# Patient Record
Sex: Male | Born: 1960 | Race: Black or African American | Hispanic: No | Marital: Single
Health system: Western US, Academic
[De-identification: ages and names within clinical notes are randomized; demographics above are authoritative.]

---

## 2015-04-01 ENCOUNTER — Emergency Department (EMERGENCY_DEPARTMENT_HOSPITAL): Payer: BLUE CROSS/BLUE SHIELD

## 2015-04-01 ENCOUNTER — Emergency Department
Admission: EM | Admit: 2015-04-01 | Discharge: 2015-04-01 | Disposition: A | Discharge status: Home / Self Care | Payer: BLUE CROSS/BLUE SHIELD | Attending: Emergency Medicine | Admitting: Emergency Medicine

## 2015-04-01 DIAGNOSIS — M545 Low back pain: Principal | ICD-10-CM | POA: Insufficient documentation

## 2015-04-01 DIAGNOSIS — T84498A Other mechanical complication of other internal orthopedic devices, implants and grafts, initial encounter: Secondary | ICD-10-CM

## 2015-04-01 DIAGNOSIS — M5441 Lumbago with sciatica, right side: Secondary | ICD-10-CM

## 2015-04-01 DIAGNOSIS — I10 Essential (primary) hypertension: Secondary | ICD-10-CM | POA: Insufficient documentation

## 2015-04-01 DIAGNOSIS — Z981 Arthrodesis status: Secondary | ICD-10-CM | POA: Insufficient documentation

## 2015-04-01 DIAGNOSIS — M4327 Fusion of spine, lumbosacral region: Secondary | ICD-10-CM

## 2015-04-01 DIAGNOSIS — G8929 Other chronic pain: Secondary | ICD-10-CM | POA: Insufficient documentation

## 2015-04-01 LAB — URINALYSIS-COMPLETE
BILIRUBIN URINE: NEGATIVE
GLUCOSE URINE: NEGATIVE mg/dL
KETONES: NEGATIVE mg/dL
LEUK. ESTERASE: NEGATIVE
NITRITE URINE: NEGATIVE
OCCULT BLOOD URINE: NEGATIVE mg/dL
PH URINE: 5 (ref 4.8–7.8)
PROTEIN URINE: NEGATIVE mg/dL
SPECIFIC GRAVITY: 1.007 (ref 1.002–1.030)
UROBILINOGEN.: NEGATIVE mg/dL (ref ?–2.0)

## 2015-04-01 LAB — BASIC METABOLIC PANEL
CALCIUM: 8.9 mg/dL (ref 8.6–10.5)
CARBON DIOXIDE TOTAL: 25 meq/L (ref 24–32)
CHLORIDE: 105 meq/L (ref 95–110)
CREATININE BLOOD: 0.94 mg/dL (ref 0.44–1.27)
GLUCOSE: 92 mg/dL (ref 70–99)
POTASSIUM: 3.9 meq/L (ref 3.3–5.0)
SODIUM: 140 meq/L (ref 135–145)
UREA NITROGEN, BLOOD (BUN): 5 mg/dL — AB (ref 8–22)

## 2015-04-01 LAB — CBC WITH DIFFERENTIAL
BASOPHILS % AUTO: 0.3 %
BASOPHILS ABS AUTO: 0 10*3/uL (ref 0–0.2)
EOSINOPHIL % AUTO: 0.4 %
EOSINOPHIL ABS AUTO: 0 10*3/uL (ref 0–0.5)
HEMATOCRIT: 42.8 % (ref 41–53)
HEMOGLOBIN: 14 g/dL (ref 13.5–17.5)
LYMPHOCYTE ABS AUTO: 1.5 10*3/uL (ref 1.0–4.8)
LYMPHOCYTES % AUTO: 21.2 %
MCH: 28.9 pg (ref 27–33)
MCHC: 32.7 % (ref 32–36)
MCV: 88.5 UM3 (ref 80–100)
MONOCYTES % AUTO: 7.2 %
MONOCYTES ABS AUTO: 0.5 10*3/uL (ref 0.1–0.8)
MPV: 8.1 UM3 (ref 6.8–10.0)
NEUTROPHIL ABS AUTO: 5.2 10*3/uL (ref 1.80–7.70)
NEUTROPHILS % AUTO: 70.9 %
PLATELET COUNT: 225 10*3/uL (ref 130–400)
RDW: 14.2 U (ref 0–14.7)
RED CELL COUNT: 4.83 10*6/uL (ref 4.5–5.9)
WHITE BLOOD CELL COUNT: 7.3 10*3/uL (ref 4.5–11.0)

## 2015-04-01 LAB — C-REACTIVE PROTEIN: C-REACTIVE PROTEIN: 0.1 mg/dL (ref 0–0.8)

## 2015-04-01 LAB — SED RATE WESTERGREN: SED RATE WESTERGREN: 10 mm/h (ref <=20)

## 2015-04-01 MED ORDER — KETOROLAC 30 MG/ML (1 ML) INJECTION SOLUTION
30.0000 mg | Freq: Once | INTRAMUSCULAR | Status: AC
Start: 2015-04-01 — End: 2015-04-01
  Administered 2015-04-01: 30 mg via INTRAVENOUS
  Filled 2015-04-01: qty 1

## 2015-04-01 MED ORDER — HYDROMORPHONE 1 MG/ML INJECTION SYRINGE
1.0000 mg | INJECTION | Freq: Once | INTRAMUSCULAR | Status: AC
Start: 2015-04-01 — End: 2015-04-01
  Administered 2015-04-01: 1 mg via INTRAVENOUS
  Filled 2015-04-01: qty 1

## 2015-04-01 MED ORDER — HYDROCODONE 10 MG-ACETAMINOPHEN 325 MG TABLET
1.0000 | ORAL_TABLET | Freq: Four times a day (QID) | ORAL | 0 refills | Status: AC | PRN
Start: 2015-04-01 — End: 2015-04-11

## 2015-04-01 MED ORDER — IBUPROFEN 800 MG TABLET
800.0000 mg | ORAL_TABLET | Freq: Three times a day (TID) | ORAL | 0 refills | Status: AC | PRN
Start: 2015-04-01 — End: 2015-04-07

## 2015-04-01 NOTE — ED Triage Note (Signed)
Hx of chronic back pain, denies any new trauma or injury. Pt is homeless and in a shelter and sleeping on a floor and says is exacerbating back pain. Taking ibuprofen and norco prn. Was seen at [facility] a few weeks ago and states was given a shot which helped pain for a while. Denies any loss of bowel or bladder.

## 2015-04-01 NOTE — ED Nursing Note (Signed)
Pt given food and change of close.  Ambulatory with cane. Dc instructions given

## 2015-04-01 NOTE — Discharge Instructions (Signed)
Thank you for visiting [place]. You have been diagnosed with back and leg pain likely from worsening of your old surgery. These symptoms and your xrays (which showed no fractures but do show loosening screws) are consistent with your history and sympotms. For now take ibuprofen and norco (10/325 - stronger than your prior prescription) for pain and to aid with sleep (DO NOT drink or drive on norco!). Take a stool softener as well (colace) to prevent constipation. Continue your care at [facility] as planned for orthopedic referral. Return to an emergency room for worsening pain, leg numbness/weakness or swelling, difficulty or accidents using the bathroom, chest pain, trouble breathing, fever, chills, headache, or any other concerns. Thank you and take care.

## 2015-04-01 NOTE — ED Nursing Note (Signed)
Pt came back from x-ray dept.  IV access inserted, blood drawn and sent to lab.  Pt medicated per MDs order.

## 2015-04-01 NOTE — ED Initial Note (Signed)
EMERGENCY DEPARTMENT PHYSICIAN NOTE - [facility]       Date of Service:   04/01/2015  7:54 PM Patient's PCP: No Pcp No Pcp   Note Started: 04/01/2015 21:11 DOB: 01-Dec-1960             Chief Complaint   Patient presents with    Back Pain Chronic           The history provided by the patient.  Interpreter used: No    Philip Hogan is a 55yr old male, with a past medical history significant for HTN, chronic lower back pain, hardware placemen for posterior fusion of L4-S1, who presents to the ED with a chief complaint of worsening back pain that occurred  2 week ago. He went to see his PCP 2 months ago and was told by imaging that the hardware was "loose". He was given norco for the pain and told to come back if the pain worsened. He had a Xray 2 months ago and a MRI on May 16th 2016.    He presents today for worsening right lower back pain with radiation down his leg. The pain is "sharp", significantly worse than normal, and is rated 9/10 in severity. He has tried norco and motin with no relief which is unusual for him. He reports the pain is alleviated by laying on his left side and is exacerbating by laying flat. Associated sciatic pain radiating down his left lower extremity. Denies saddle anaesthesia, fever, chills, difficulty breathing, chest pain, urinary or bowel hesitancy or incontinents, IVDA or other recent infection. Denies any new injury or trauma.    Pt following at [facility], has referral to both ortho spine and pain management. Next appointment on 04/04/15.    A full history, including pertinent past medical and social history was reviewed.    HISTORY:  There are no active hospital problems to display for this patient.   No Known Allergies   No past medical history on file. No past surgical history on file.   Social History    Marital status: SINGLE              Spouse name:                       Years of education:                 Number of children:               Occupational History    None on  file    Social History Main Topics    Smoking status: Not on file                          Smokeless status: Not on file                       Alcohol use: Not on file     Drug use: Not on file     Sexual activity: Not on file          Other Topics            Concern    None on file    Social History Narrative    None on file     No family history on file.             Review of Systems   Constitutional: Negative  for chills and fever.   HENT: Negative for sore throat.    Respiratory: Negative for shortness of breath.    Cardiovascular: Negative for chest pain.   Gastrointestinal: Negative for diarrhea, nausea and vomiting.   Genitourinary: Negative for dysuria.   Musculoskeletal: Positive for back pain.   Skin: Negative for rash.   Neurological:        No saddle anaesthesia.        TRIAGE VITAL SIGNS:  Temp: 36.5 C (97.7 F) (04/01/15 1836)  Temp src: Oral (04/01/15 1836)  Pulse: 82 (04/01/15 1836)  BP: 114/66 (04/01/15 1836)  Resp: 18 (04/01/15 1836)  SpO2: 100 % (04/01/15 1836)  Weight:  (refused) (04/01/15 1836)    Physical Exam   Constitutional: He is oriented to person, place, and time. He appears well-developed and well-nourished. No distress.   HENT:   Head: Normocephalic and atraumatic.   Eyes: Conjunctivae and EOM are normal. Right eye exhibits no discharge. Left eye exhibits no discharge.   Neck: Normal range of motion. Neck supple.   Cardiovascular: Normal rate, regular rhythm, normal heart sounds and intact distal pulses.  Exam reveals no gallop and no friction rub.    No murmur heard.  Pulmonary/Chest: Effort normal and breath sounds normal. No respiratory distress.   Abdominal: Soft. Bowel sounds are normal. There is no tenderness.   Musculoskeletal:   BLE: No foot clonus or menisci.  Back: Right lower TTP.   Neurological: He is alert and oriented to person, place, and time.   Skin: Skin is warm and dry. No rash noted. He is not diaphoretic.   Psychiatric: He has a normal mood and affect. His behavior  is normal.   Nursing note and vitals reviewed.        INITIAL ASSESSMENT & PLAN, MEDICAL DECISION MAKING, ED COURSE  Philip Hogan is a 14yr male who presents with a chief complaint of back pain.     Differential includes, but is not limited to: Exacerbated chronic lower back pain, hardware migration, kidney stones, AAA. Not clinically concerning for cervical spinal osteomyelitis, abscess, cord involvement.      The results of the ED evaluation were notable for the following:    Pertinent lab results:   Labs Reviewed   BASIC METABOLIC PANEL - Abnormal; Notable for the following:        Result Value    UREA NITROGEN, BLOOD (BUN) 5 (*)     All other components within normal limits   URINALYSIS-COMPLETE   CBC WITH DIFFERENTIAL   SED RATE WESTERGREN   C-REACTIVE PROTEIN    Essentially benign urine, basic labs and inflam markers    Pertinent imaging results (reviewed and interpreted independently by me):   Point of care emergency department limited ultrasound of the abdominal aorta:  Indication: back pain.    Procedure in detail: Using a convex array transducer, axial and sagittal planes of the abdominal aorta were obtained from the level of the celiac axis superiorly down to the level of the bifurcation.  The aorta measured less than 3 cm at its largest diameter.    Impression: Point of care limited ultrasound without evidence of abdominal aortic aneurysm.    These images were archived digitally and I independently interpreted the images at the bedside and agree with the documented results.        L-SPINE 2 OR 3 VIEWS: hardware to L spine and sacral spine, associated with s1 lucidity, no evidence of osteo, no priors for comparison  Radiology reads:   L-SPINE 2 OR 3 VIEWS  Status post posterior fusion of L4-S1with mild surrounding lucency  surrounding the sacral screws may be related to hardware complication.    Pertinent medications:   Medications   Hydromorphone (DILAUDID) Injection 1 mg (1 mg IV Given 04/01/15  2126)   Ketorolac (TORADOL) Injection 30 mg (30 mg IV Given 04/01/15 2126)         Chart Review: I reviewed the patient's prior medical records. Pertinent information that is relevant to this encounter: None via [place]. CURES database revealed one rx for norco 5 on 1/11 for 20 tablets, c/w pt's hx.       Patient Summary: Philip Hogan is a 55yr old male presenting to the ED today with back pain. Patient clinically well-appearing in the ED with normal vital signs, nontoxic.  History physical exam and work-up consistent with chronic lower back pain with possible hardware migration component. Patient treated here with dilaudid and Toradol for pain relief. Rx given for norco 10 after CURES inquiry and discussion of safe opiate use, outpt f/up plan. Patient discharged, instructed to follow-up with PCP in 3 days as planned for further referral. All questions answered, return precautions discussed.        LAST VITAL SIGNS:  Temp: 36.5 C (97.7 F) (04/01/15 2237)  Temp src: Oral (04/01/15 2237)  Pulse: 62 (04/01/15 2237)  BP: 131/80 (04/01/15 2237)  Resp: 18 (04/01/15 2237)  SpO2: 100 % (04/01/15 2237)  Weight:  (refused) (04/01/15 1836)      Clinical Impression: Lower back pain      Disposition: Discharge. Follow up with PCP. ED discharge instructions were reviewed and provided.      PATIENT'S GENERAL CONDITION:  Fair: Vital signs are stable and within normal limits. Patient is conscious but may be uncomfortable. Indicators are favorable.    SCRIBE STATEMENT  I, Marcina Millard, SCRIBE,  am personally taking down the notes in the presence of Dr. Raynelle Fanning.  Electronically signed by - Marcina Millard, SCRIBE, Scribe  04/01/2015  21:11      SCRIBE DISCLAIMER  I, Oneita Jolly, MD, personally performed the services described in this documentation, as scribed by the trained medical scribe above in my presence, and it is both accurate and complete.     Electronically signed by: Oneita Jolly, MD - Attending  Physician

## 2019-06-13 IMAGING — MR MRI LSPINE WO CONTRAST
5 of 6 series · 34 of 48 positions shown · non-contrast
Comparison: CT lumbar spine 06/13/2019

HISTORY: Spinal stenosis, Laminectomy syndrome
TECHNIQUE: Routine multiplanar MRI of the lumbar spine was performed without IV contrast.

[Series 2: t2_sag · sagittal · 4.0mm · 0.87mm/px · 6 of 19 slices shown]
[im 1/19]
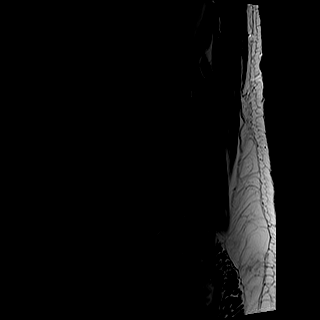
[im 4/19]
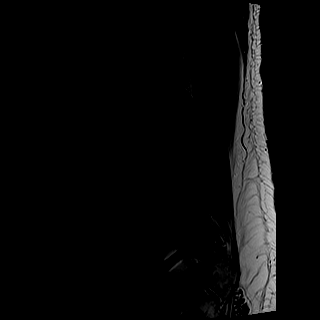
[im 8/19]
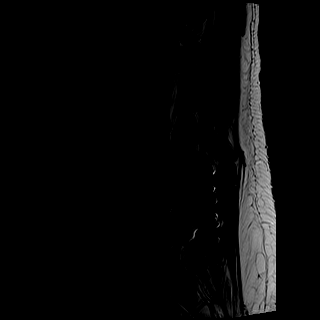
[im 11/19]
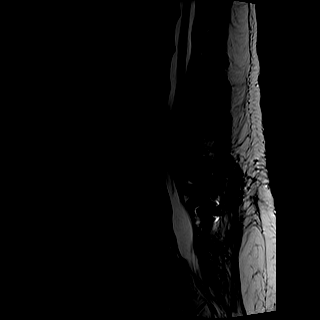
[im 15/19]
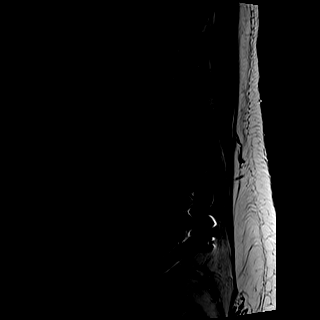
[im 19/19]
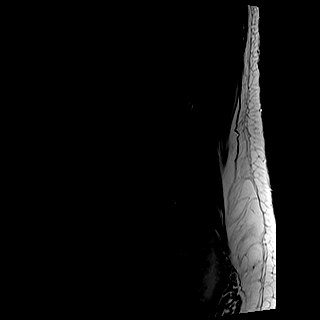

[Series 3: t1_sag · sagittal · 4.0mm · 0.87mm/px · 7 of 19 slices shown]
[im 1/19]
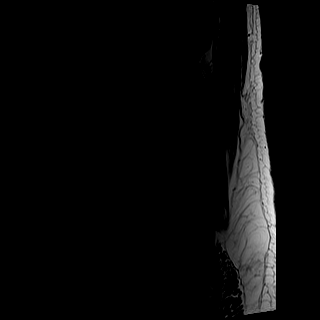
[im 4/19]
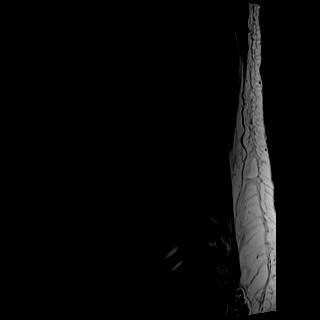
[im 7/19]
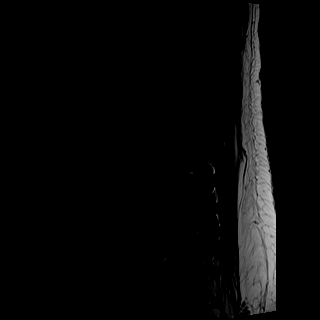
[im 10/19]
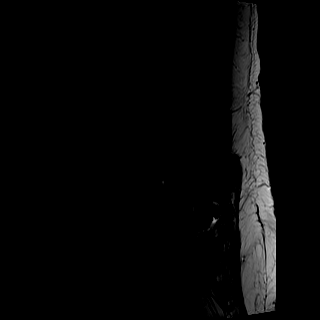
[im 13/19]
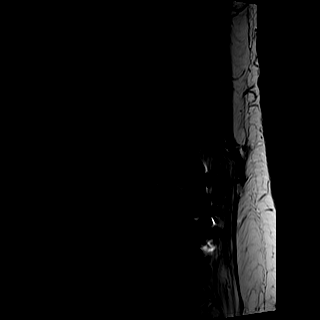
[im 16/19]
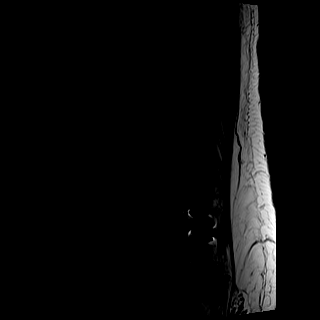
[im 19/19]
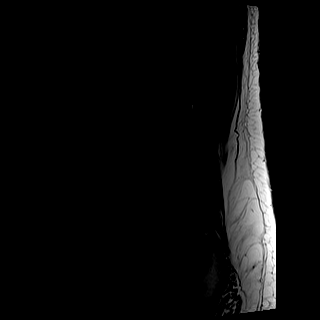

[Series 4: ir_sag · sagittal · 4.0mm · 1.09mm/px · 7 of 19 slices shown]
[im 1/19]
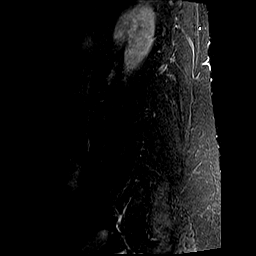
[im 4/19]
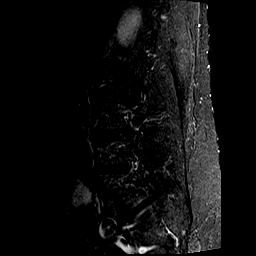
[im 7/19]
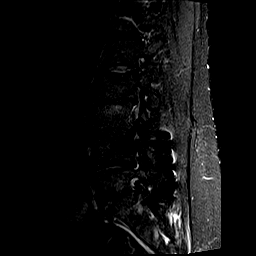
[im 10/19]
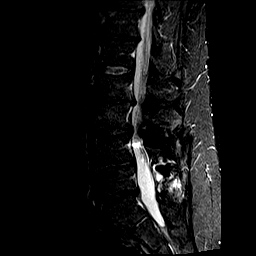
[im 13/19]
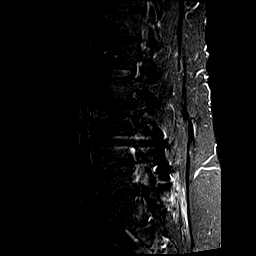
[im 16/19]
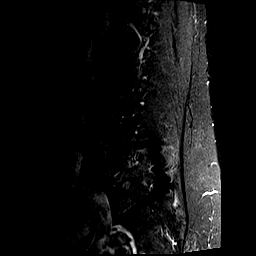
[im 19/19]
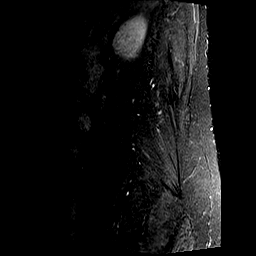

[Series 5: t2_cor · coronal · 4.0mm · 0.88mm/px · 6 of 17 slices shown]
[im 1/17]
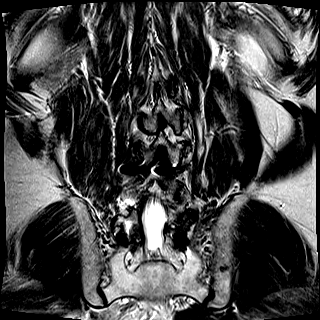
[im 4/17]
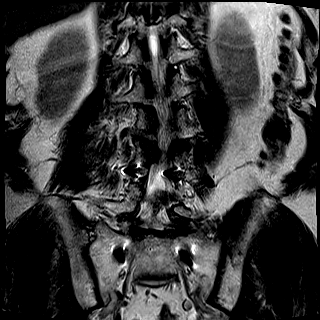
[im 7/17]
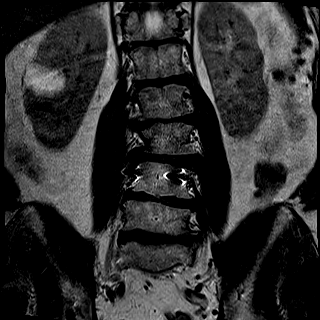
[im 10/17]
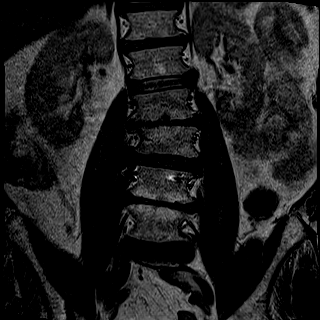
[im 13/17]
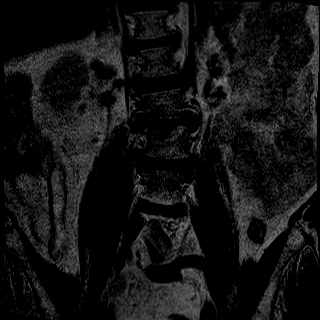
[im 17/17]
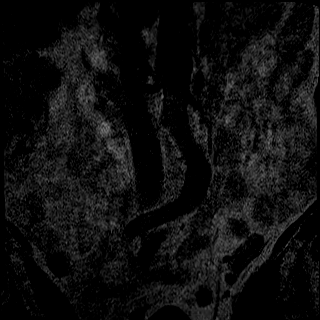

[Series 6: t2_axial · axial · 4.0mm · 0.39mm/px · z∈[-83,+106]mm · 8 of 39 slices shown]
[im 1/39]
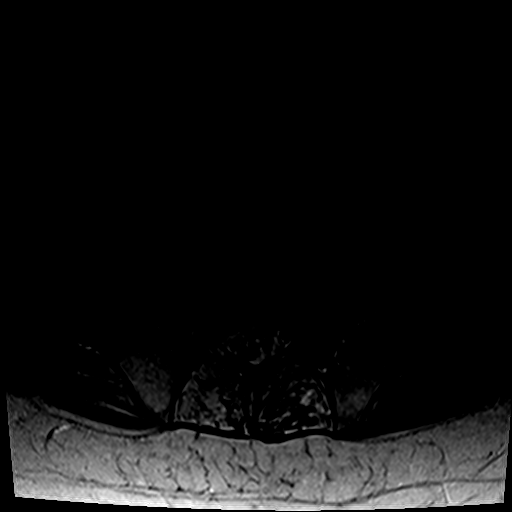
[im 7/39]
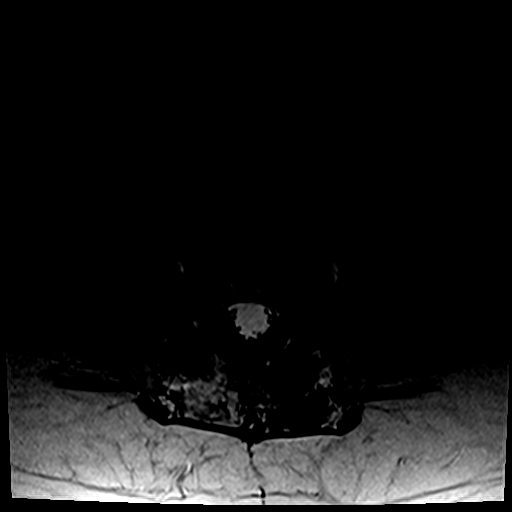
[im 13/39]
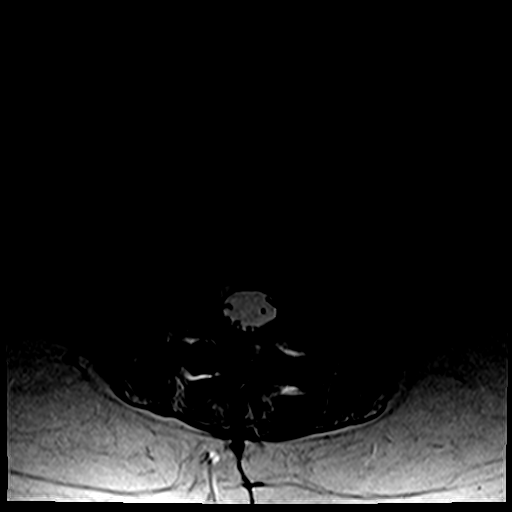
[im 16/39]
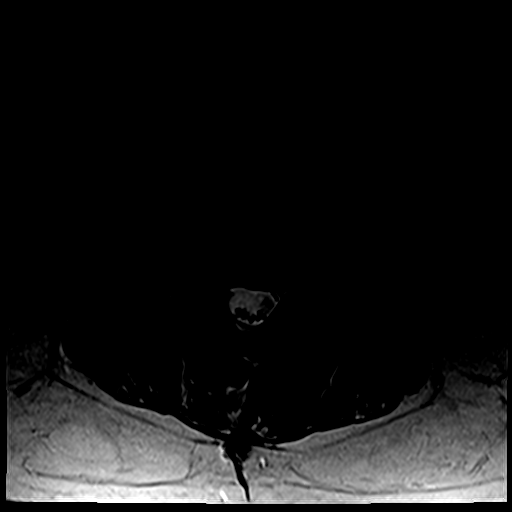
[im 23/39]
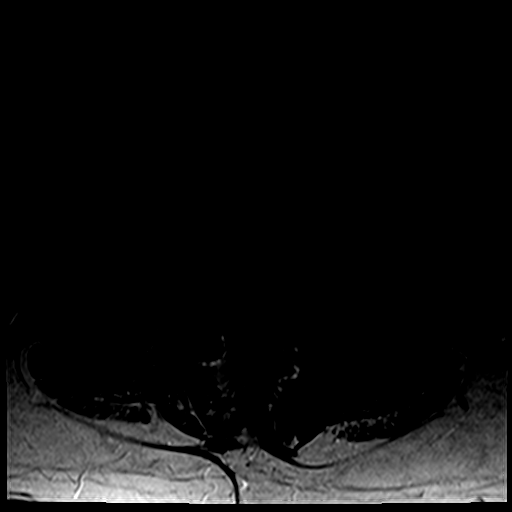
[im 26/39]
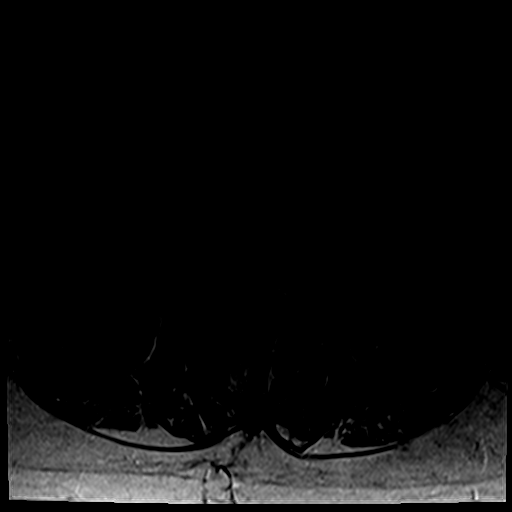
[im 32/39]
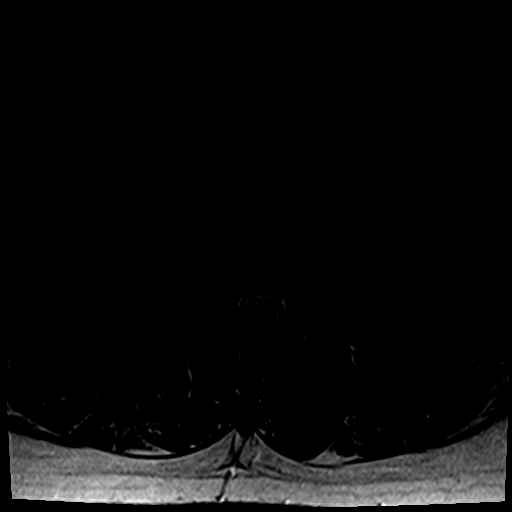
[im 39/39]
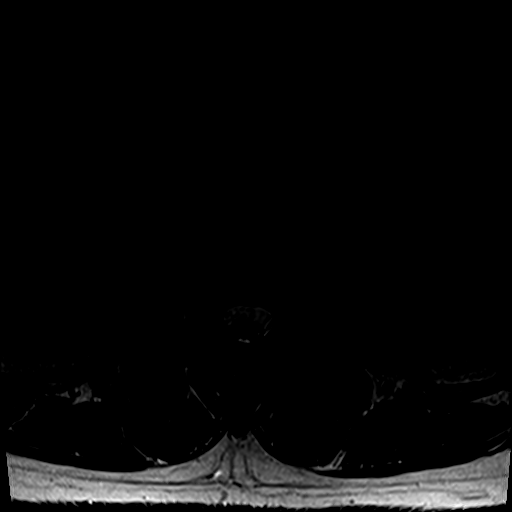

[34 of 48 positions shown; findings below may reference images not displayed]

FINDINGS: Postoperative changes of decompressive laminectomy at L4-5 and L5-S1. Pedicle screws and connecting rods from L4 to S1. Orthopedic hardware produces significant surrounding susceptibility artifact. Mild lumbar levoscoliosis. Mild grade 1 anterolisthesis L3-4. Mild grade 1 retrolisthesis L5-S1. Multilevel degenerative disc desiccation, loss of disc space height and diffuse disc bulging. No abnormal signal in an intradural or paraspinous position. Conus medullaris terminates normally at L1-2 and maintains normal signal. Cauda equina is unremarkable.

L1-2: No disc bulge. No disc herniation. Mild facet arthropathy. No canal or foraminal stenosis.

L2-3: Moderate diffuse disc bulge. Moderate facet arthropathy. Moderate canal stenosis and moderate right foraminal stenosis. Mild left foraminal stenosis.

L3-4: Prominent diffuse disc bulge. Advanced facet arthropathy. Severe canal stenosis. Moderate to severe right foraminal stenosis. Mild to moderate left foraminal stenosis.

L4-5: Decompressive laminectomy. Fusion hardware. Mild diffuse disc bulge. No canal stenosis. Mild foraminal stenoses.

L5-S1: Decompressive laminectomy. Diffuse disc bulge. Fusion hardware. No canal stenosis. Moderate to severe foraminal stenoses.
IMPRESSION: Postoperative changes and degenerative changes as detailed above. Degenerative factors combine to produce multilevel canal and foraminal stenoses as detailed above. Most notably, there is severe canal stenosis at L3-4. Moderate to severe right foraminal stenosis L3-4. Moderate to severe bilateral foraminal stenoses L5-S1. No fractures or destructive lesions. Scoliosis. Mild Grade 1 anterolisthesis L3-4. Minimal grade 1 retrolisthesis L5-S1.

## 2019-06-13 IMAGING — CT CT LSPINE WO CONTRAST
3 of 5 series · 12 of 33 positions shown, 14 images · non-contrast
Comparison: 06/13/19 MRI

HISTORY: Spinal stenosis, Laminectomy syndrome
TECHNIQUE: CT images of the lumbar spine were obtained without administration of intravenous contrast.

[Series 7: coronal · coronal · 0.35mm/px · 3 of 67 slices shown]
[im 14/67  bone]
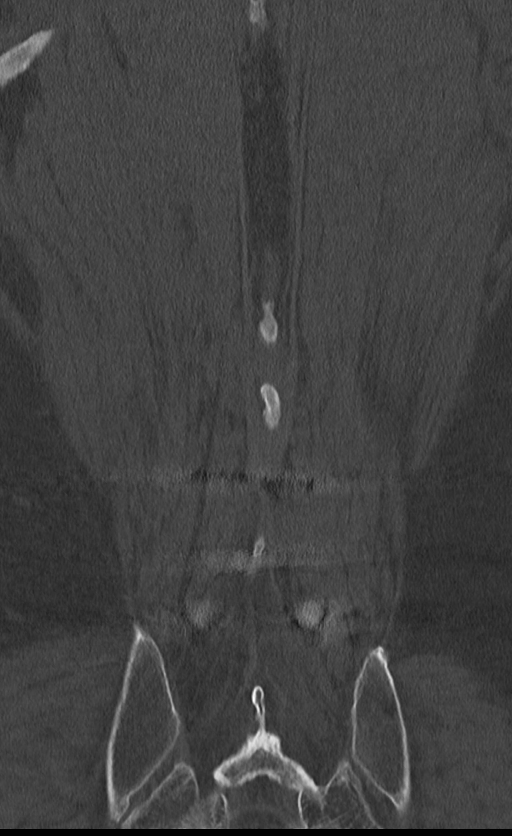
[im 27/67  bone]
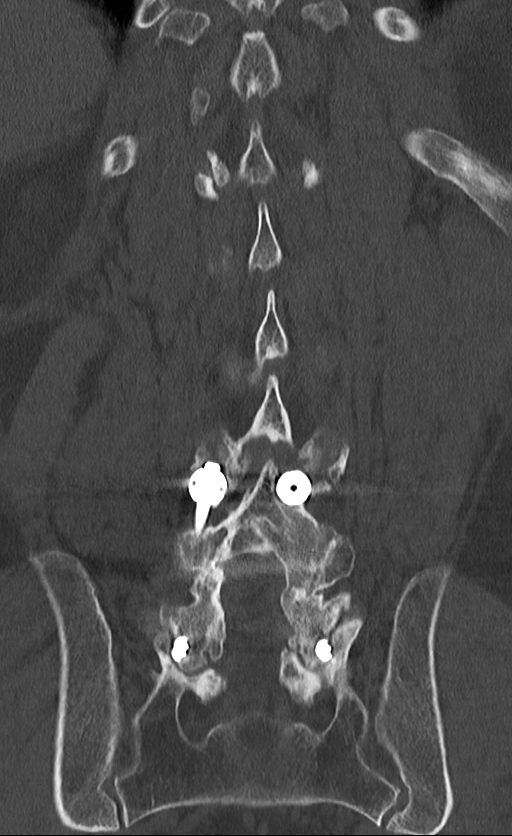
[im 40/67  bone]
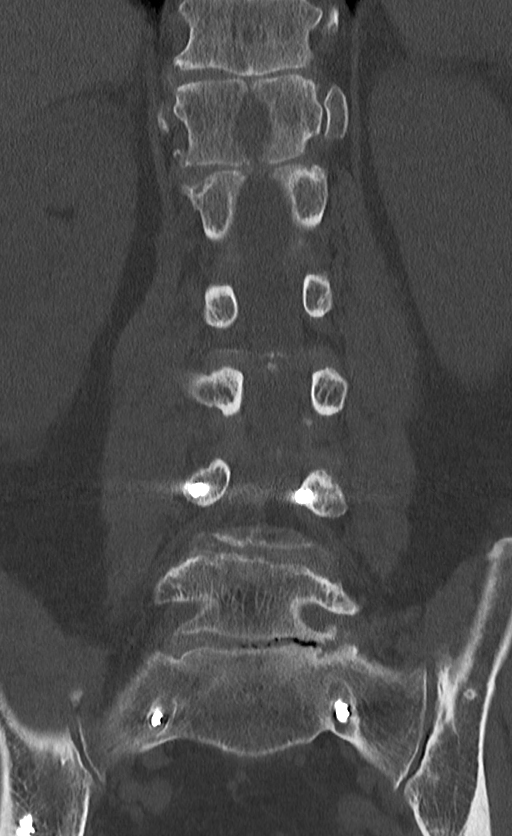

[Series 9: sagittal · sagittal · 0.35mm/px · 5 of 61 slices shown, 6 images]
[im 21/61  bone]
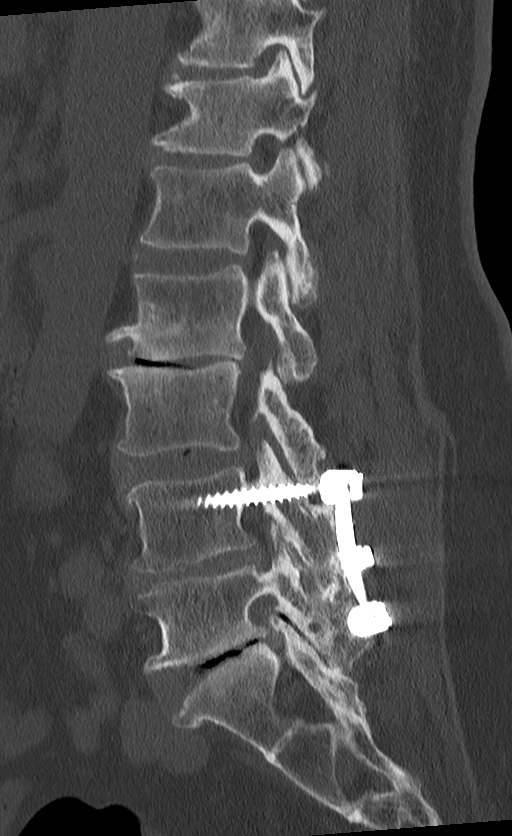
[im 26/61  bone]
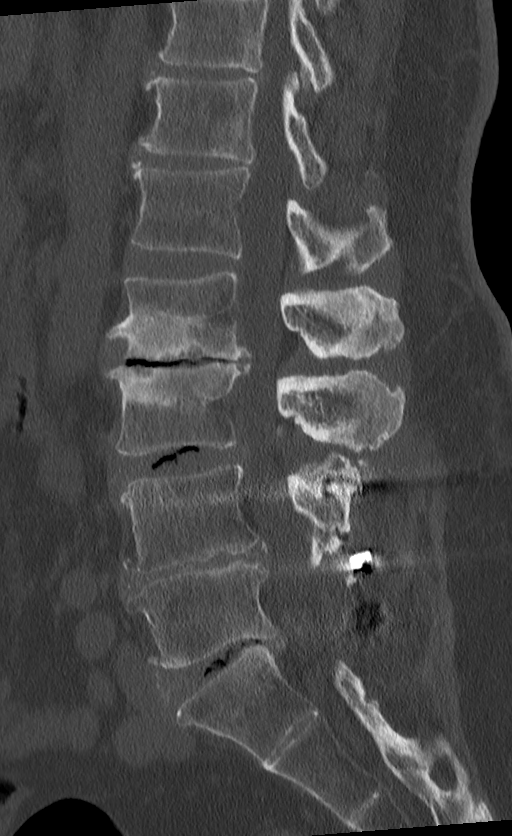
[im 31/61  soft-tissue]
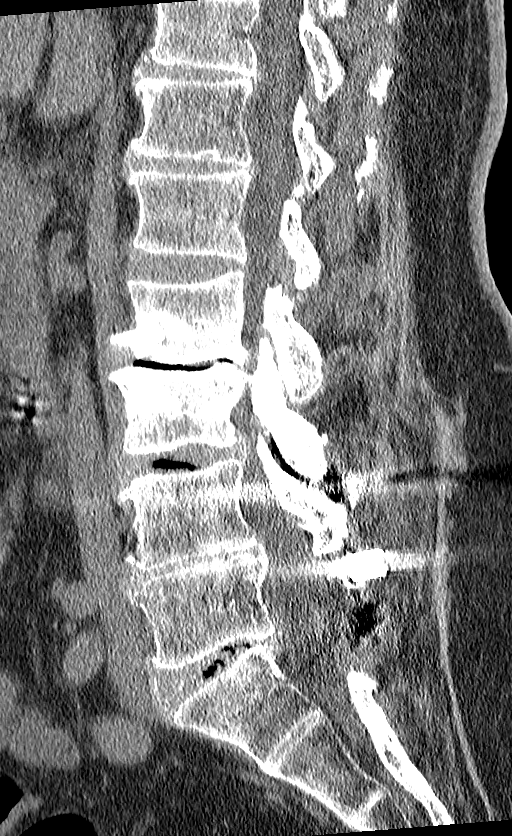
[im 31/61  bone]
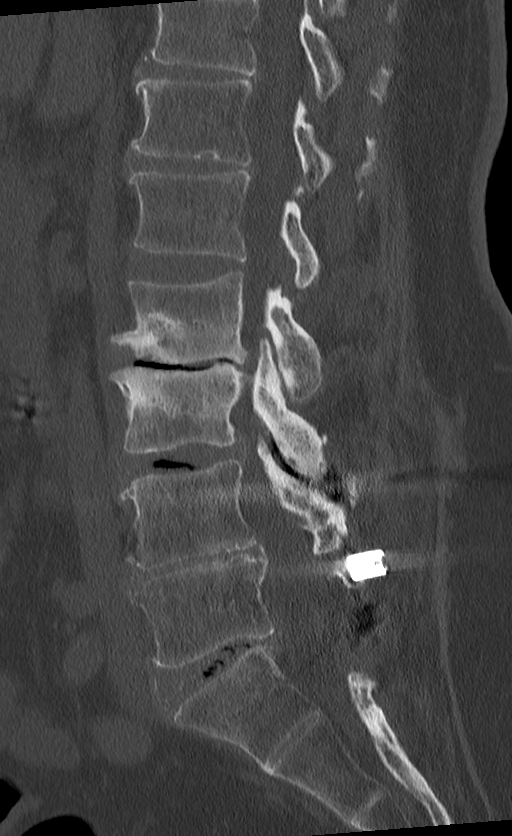
[im 36/61  bone]
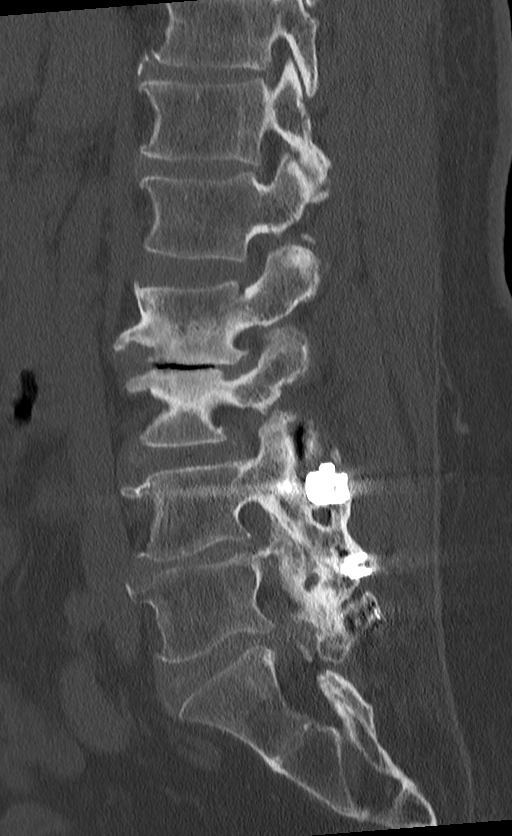
[im 41/61  bone]
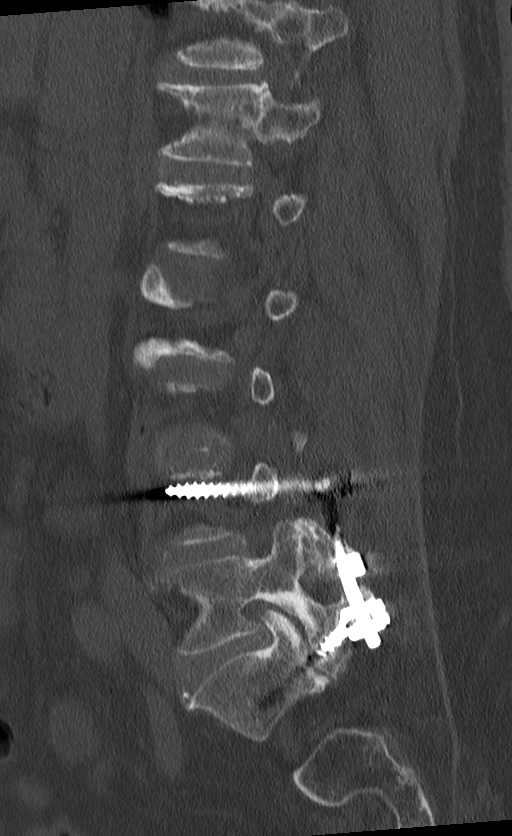

[Series 12: spine ranges · axial · 0.31mm/px · z∈[-1471,-1270]mm · 4 of 137 slices shown, 5 images]
[im 23/137  soft-tissue]
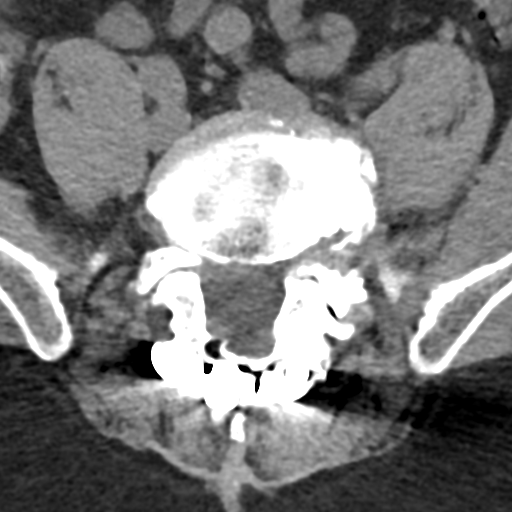
[im 23/137  bone]
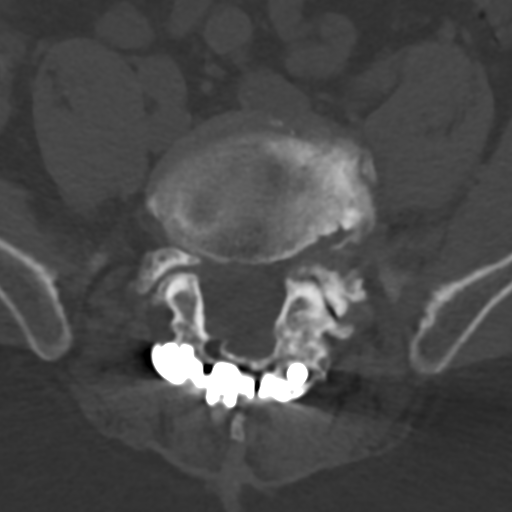
[im 46/137  bone]
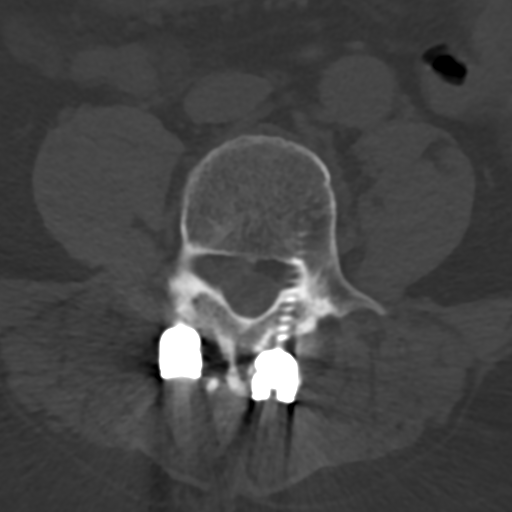
[im 91/137  bone]
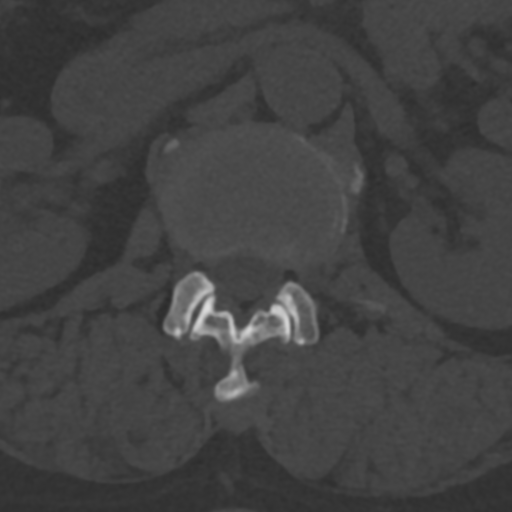
[im 114/137  bone]
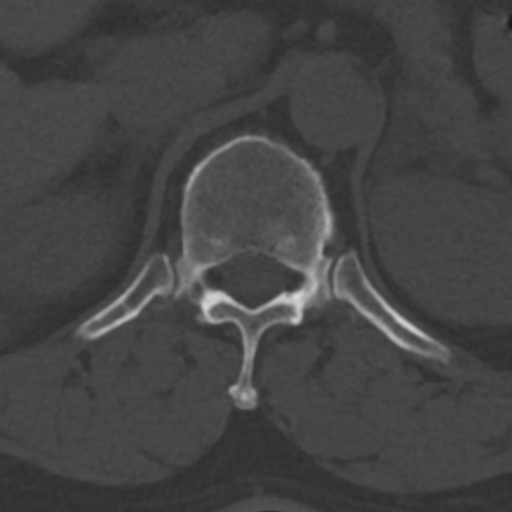

[12 of 33 positions shown; findings below may reference images not displayed]

FINDINGS: At T12-L1, moderate disc space narrowing. Mild diffuse degenerated disc bulge although no spinal stenosis.

At L1-L2,  there is no significant disc herniation, central spinal canal stenosis or neural foraminal narrowing.

At L2-L3, severe disc space narrowing with vacuum phenomenon and dense sclerosis on either side. Large diffuse posterior, mostly hard disc, complex effaces the thecal sac although only yields mild spinal stenosis on nonintrathecal contrast CT. Moderate to severe right and moderate left neural foraminal narrowings.

At L3-L4, large diffuse degenerated disc bulge, moderate facet arthropathy with vacuum phenomenon, and ligamentous buckling to 5 mm, yields moderate to severe spinal stenosis with severe right and moderate left foraminal narrowings.

At L4-L5, posterior hardware fusion. The right L4 pedicle screw has its distal one-third projecting outside the bone and into the right psoas muscle area. I see no hardware loosening on CT, allowing for some hardening artifact. There is a moderate right, mild left neural foraminal narrowing by what looks to be a far right lateral disc bulge. No spinal stenosis.

At L5-S1, posterior laminectomy. Severe bilateral neural foraminal narrowings combination diffuse degenerated vacuum disc bulge along with uncovertebral joint spurs
IMPRESSION: Multilevel degenerative changes with spinal and foraminal stenoses per above.

Total radiation dose to patient is CTDIvol 33.37 mGy and DLP 1042.66 mGy-cm.

## 2020-12-01 IMAGING — MR MRI WRIST LT WO CONTRAST
6 series · 40 of 40 positions shown · non-contrast
Comparison: None.

INDICATION: Wrist pain.
TECHNIQUE: Multiplanar, multiecho imaging of the left wrist was performed, including T1-weighted and fluid sensitive sequences without intravenous contrast administration.

[Series 3: t1_cor · coronal · left · 3.0mm · 0.26mm/px · 5 of 16 slices shown]
[im 1/16]
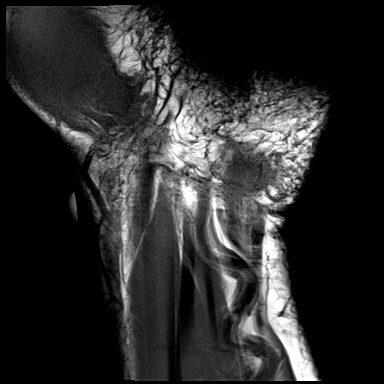
[im 4/16]
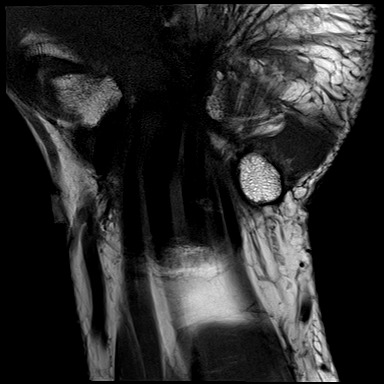
[im 8/16]
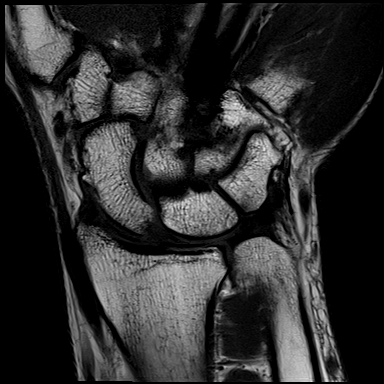
[im 12/16]
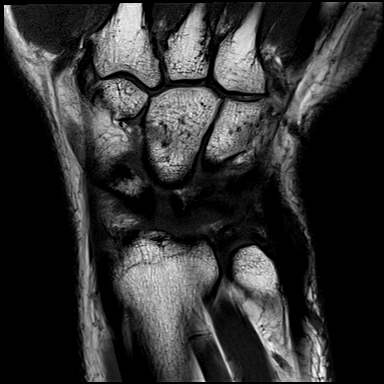
[im 16/16]
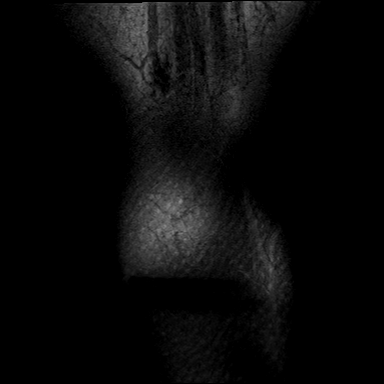

[Series 4: t2_cor_fs · coronal · left · 3.0mm · 0.31mm/px · 5 of 16 slices shown]
[im 1/16]
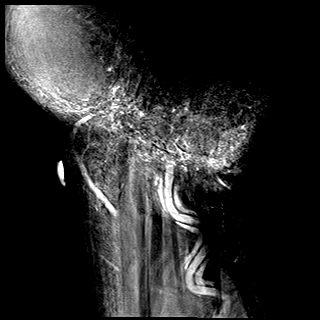
[im 4/16]
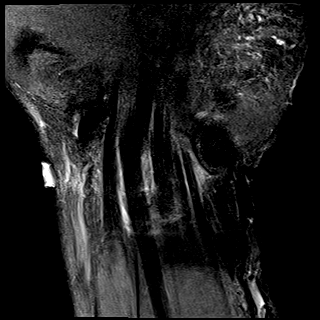
[im 8/16]
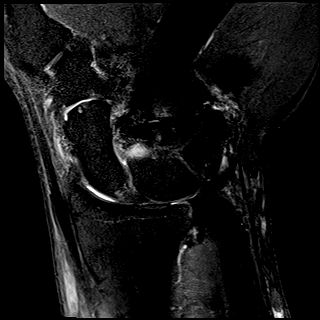
[im 12/16]
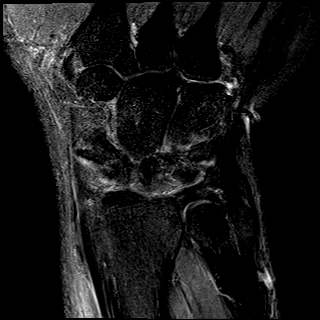
[im 16/16]
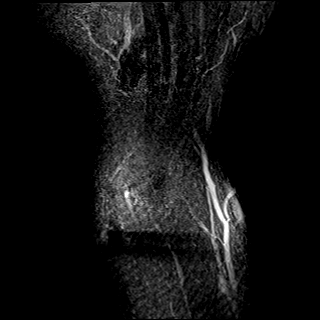

[Series 5: t2_cor_fs_space · coronal · left · 0.9mm · 0.33mm/px · 12 of 44 slices shown]
[im 1/44]
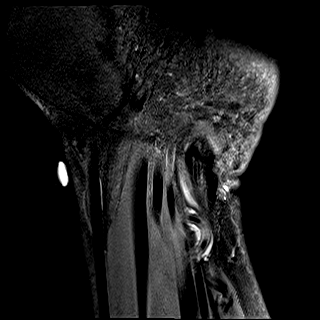
[im 4/44]
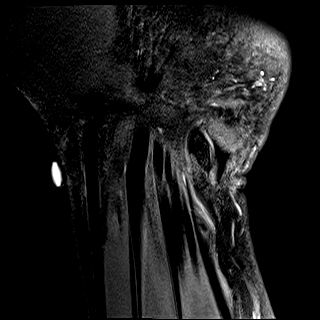
[im 8/44]
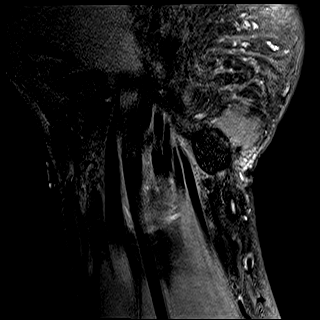
[im 12/44]
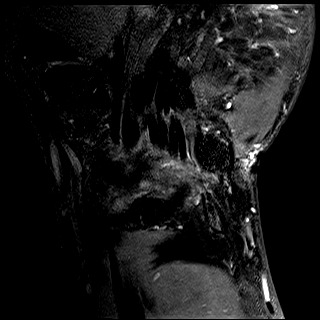
[im 16/44]
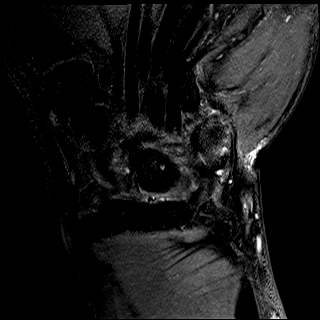
[im 20/44]
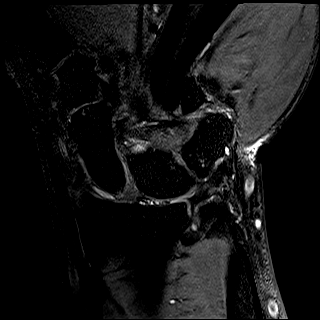
[im 24/44]
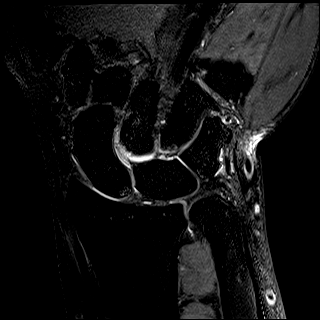
[im 28/44]
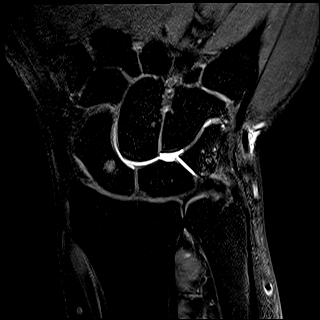
[im 32/44]
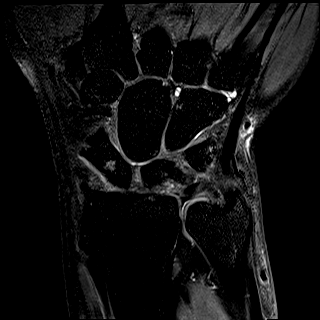
[im 36/44]
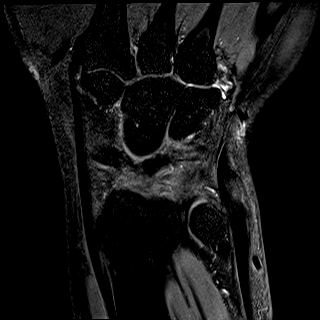
[im 40/44]
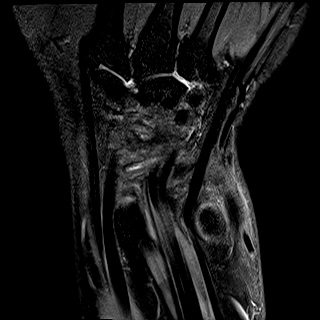
[im 44/44]
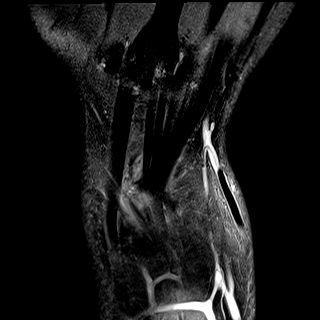

[Series 6: t1_axial · axial · left · 3.0mm · 0.26mm/px · z∈[-29,+32]mm · 6 of 22 slices shown]
[im 1/22]
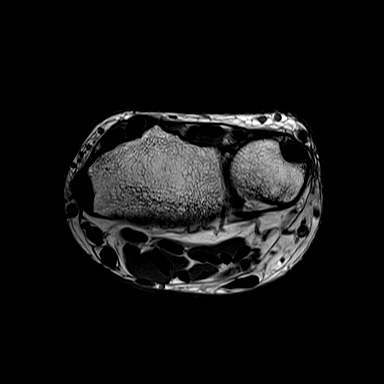
[im 5/22]
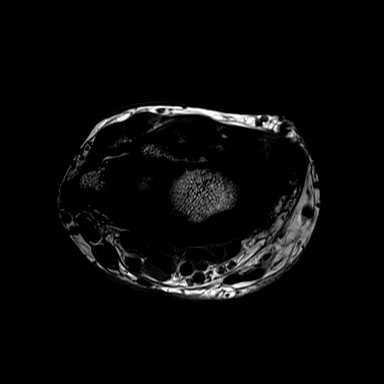
[im 9/22]
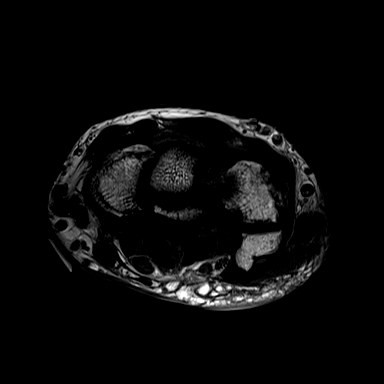
[im 13/22]
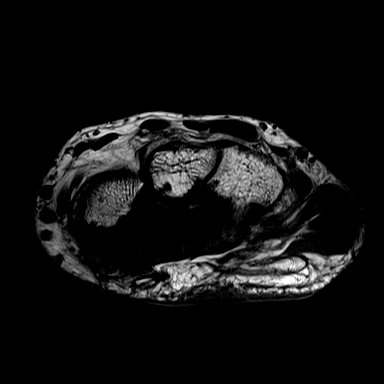
[im 17/22]
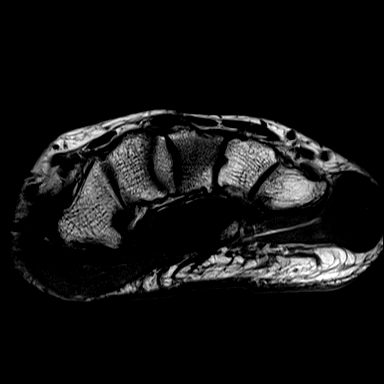
[im 22/22]
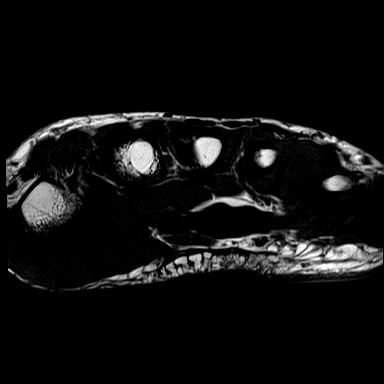

[Series 7: t2_axial_fs · axial · left · 3.0mm · 0.31mm/px · z∈[-29,+32]mm · 6 of 22 slices shown]
[im 1/22]
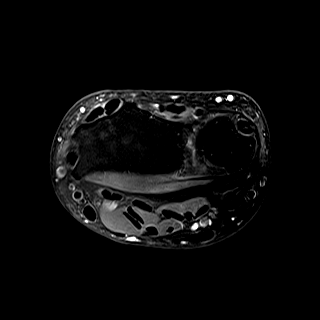
[im 5/22]
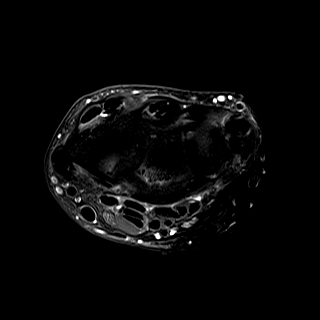
[im 9/22]
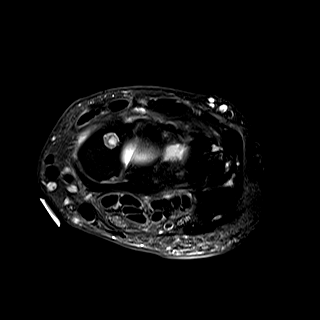
[im 13/22]
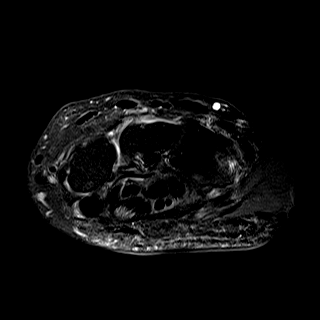
[im 17/22]
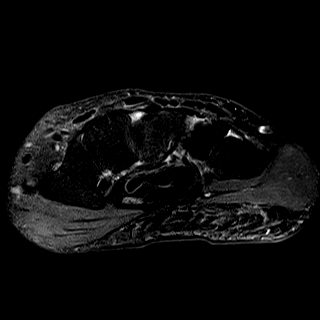
[im 22/22]
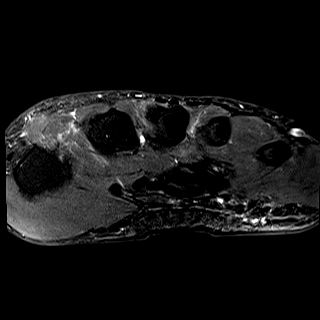

[Series 8: t2_sag_fs · sagittal · left · 3.0mm · 0.39mm/px · 6 of 22 slices shown]
[im 1/22]
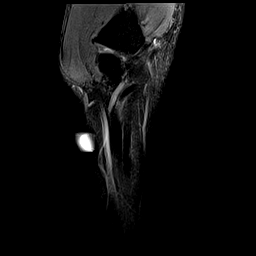
[im 5/22]
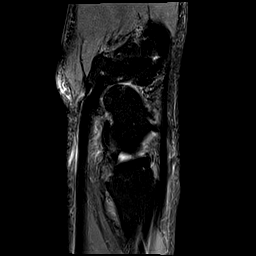
[im 9/22]
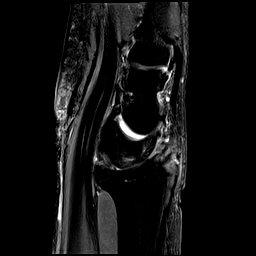
[im 13/22]
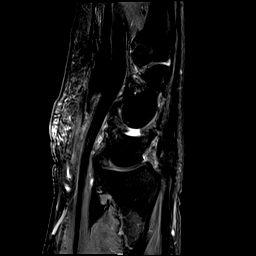
[im 17/22]
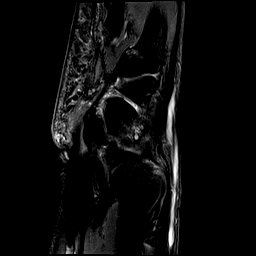
[im 22/22]
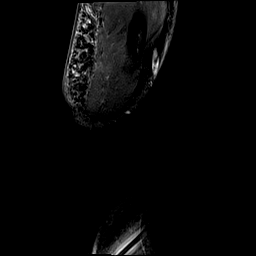

[40 of 40 positions shown; findings below may reference images not displayed]

FINDINGS: Extensor tendons: Moderate focal tendinosis of the extensor pollicis brevis, at the level of the radial tuberosity. No tenosynovitis. Abductor pollicis longus tendon is intact. Mild tendinosis and interstitial tearing of the extensor carpal ulnaris, at the level of the ulnar styloid.

Flexor tendons:  Mild tenosynovitis of the flexor carpi radialis, at the level of the carpus.

Triangular fibrocartilage complex: Scarring and irregularity at the peripheral attachment of the TFC.

Scapholunate ligament: Thickening and scarring of the volar and dorsal bands, compatible with chronic injury. No widening of the interval.

Lunotriquetral ligament: Intact.

Carpal bones: No acute fracture. No AVN. No erosion. Intraosseous ganglion cyst within the scaphoid. Subchondral marrow edema at the triscaphe joint. Small intraosseous ganglion within the triquetrum. Mild subchondral marrow edema at the proximal pole triquetrum.

Cartilage: Moderate pisotriquetral joint DJD. Severe triscaphe joint DJD. Mild first CMC joint DJD. Mild radiocarpal joint DJD. Mild lunatocapitate joint DJD.

Carpal tunnel: Normal.

Other: No acute muscle injury. No fluid collection. No soft tissue mass. No neurogenic muscle edema.
IMPRESSION: 1.
Moderate focal tendinosis of the extensor pollicis brevis, at the level of the radial tuberosity.

2.
Mild tendinosis and interstitial tearing of the extensor carpi ulnaris, at the level of the ulnar styloid.

3.
Severe triscaphe joint DJD.

4.
Moderate pisotriquetral joint DJD.

5.
Mild radiocarpal, midcarpal, and first CMC joint DJD.

## 2021-02-14 IMAGING — MR MRI HAND LT WO CONTRAST
7 of 8 series · 36 of 40 positions shown · non-contrast
Comparison: None available.

INDICATION: Pain in left hand.
TECHNIQUE: Multiplanar, multisequence imaging of the left hand was performed without contrast.

[Series 4: t1_cor · coronal · left · 2.0mm · 0.52mm/px · 4 of 26 slices shown]
[im 1/26]
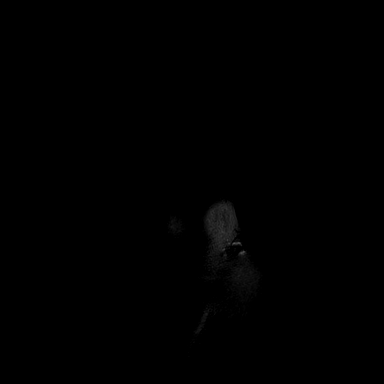
[im 9/26]
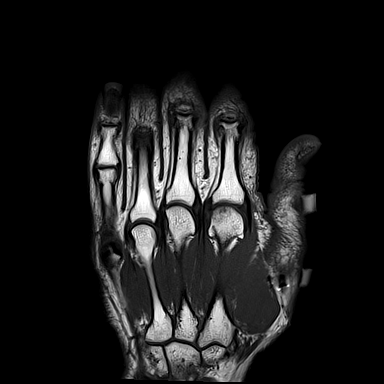
[im 17/26]
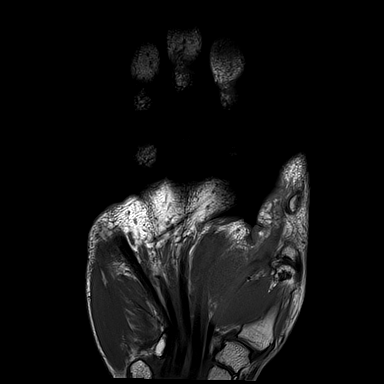
[im 26/26]
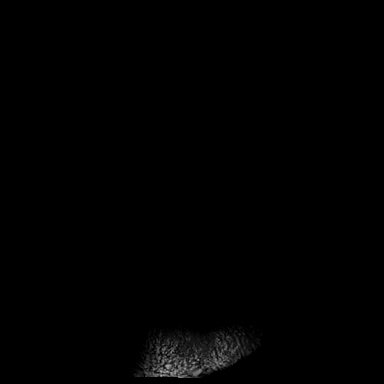

[Series 5: t2_cor_fs · coronal · left · 2.0mm · 0.62mm/px · 4 of 26 slices shown]
[im 1/26]
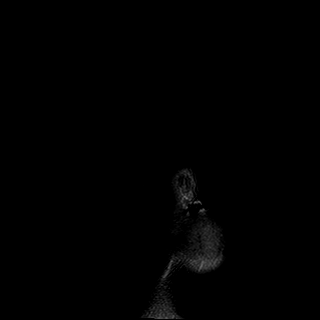
[im 9/26]
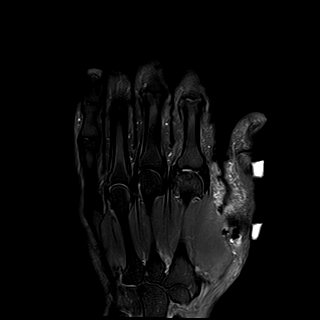
[im 17/26]
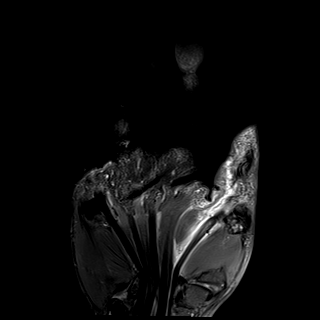
[im 26/26]
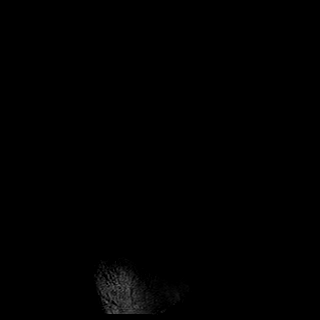

[Series 6: t1_axial · axial · left · 3.0mm · 0.39mm/px · z∈[-139,+17]mm · 7 of 50 slices shown]
[im 1/50]
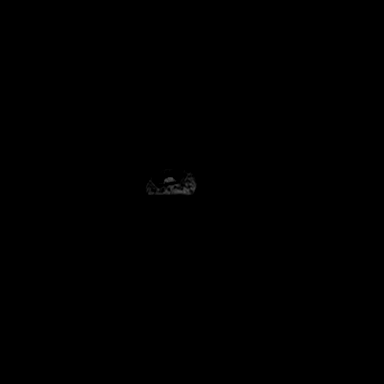
[im 9/50]
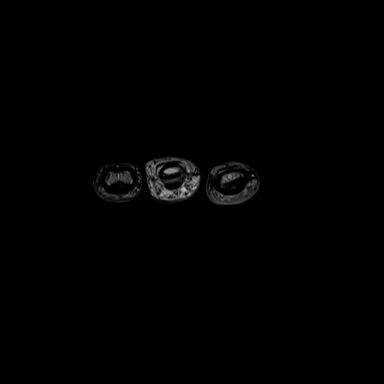
[im 17/50]
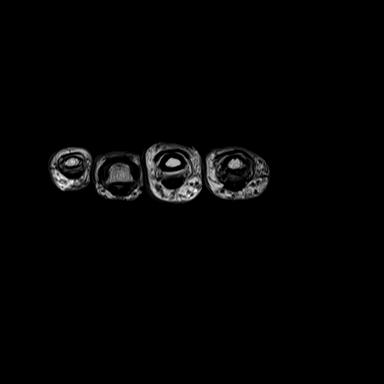
[im 25/50]
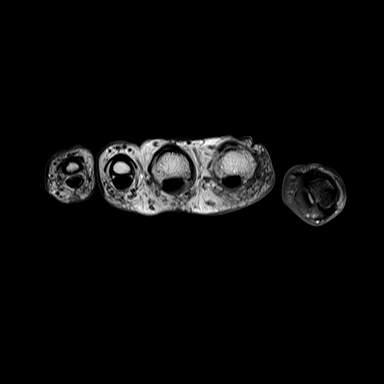
[im 33/50]
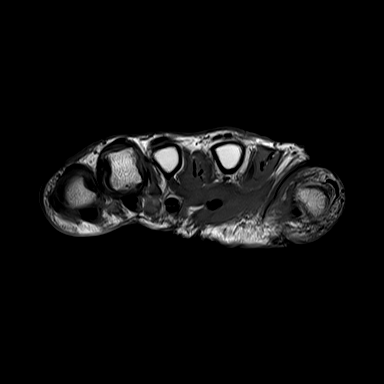
[im 41/50]
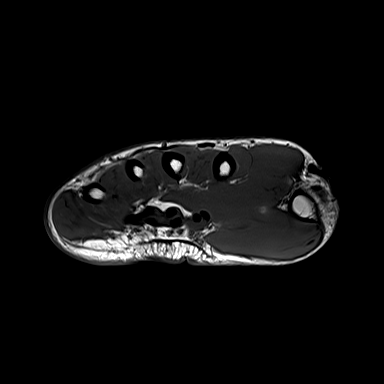
[im 50/50]
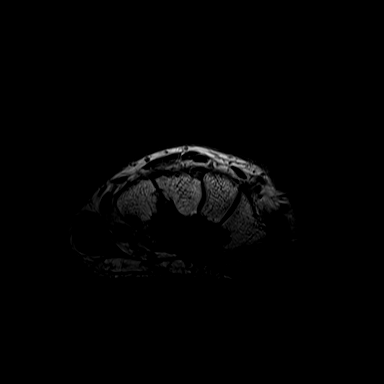

[Series 7: stir_cor · coronal · left · 2.0mm · 0.62mm/px · 4 of 26 slices shown]
[im 1/26]
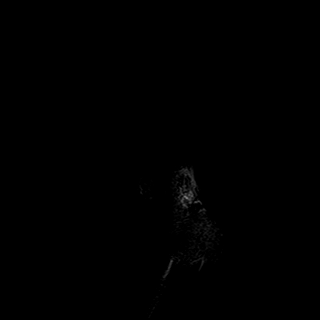
[im 9/26]
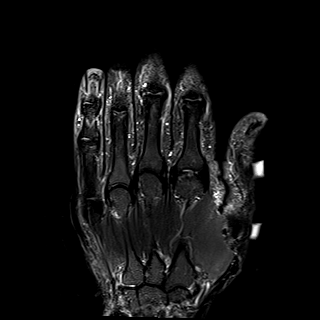
[im 17/26]
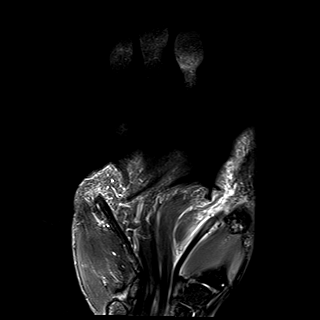
[im 26/26]
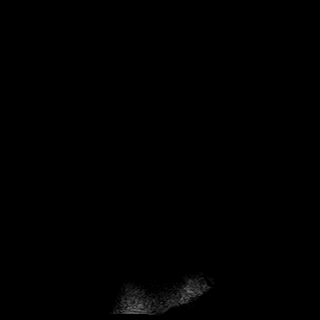

[Series 8: t1_sag · sagittal · left · 3.0mm · 0.49mm/px · 5 of 37 slices shown]
[im 1/37]
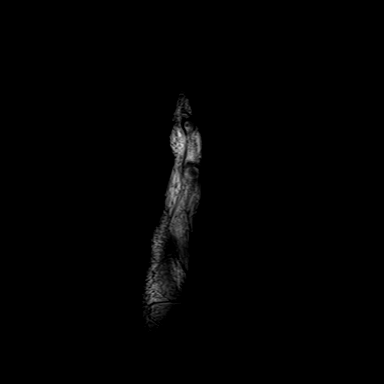
[im 10/37]
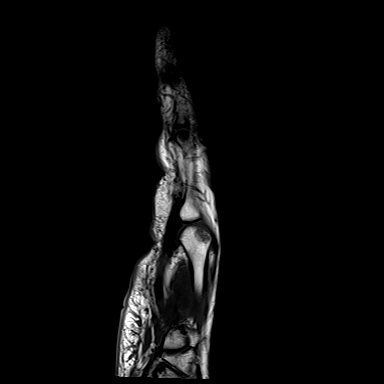
[im 19/37]
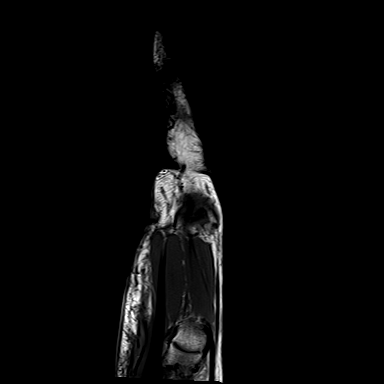
[im 28/37]
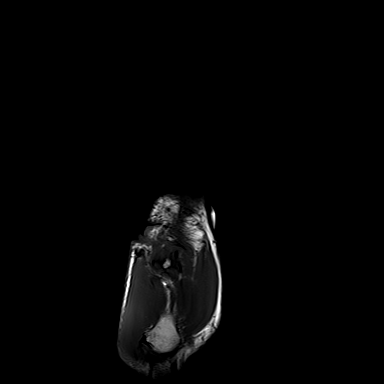
[im 37/37]
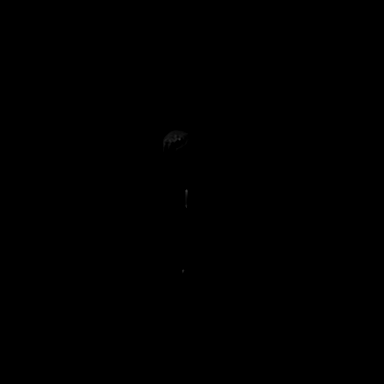

[Series 9: ir_sag · sagittal · left · 3.0mm · 0.49mm/px · 5 of 38 slices shown]
[im 1/38]
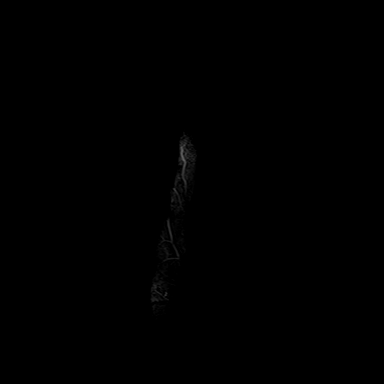
[im 10/38]
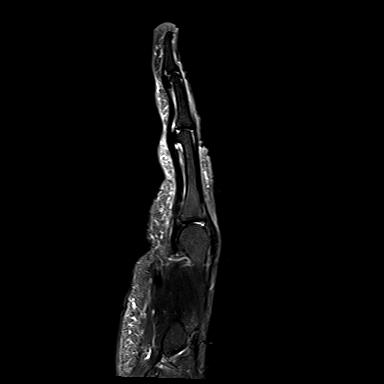
[im 19/38]
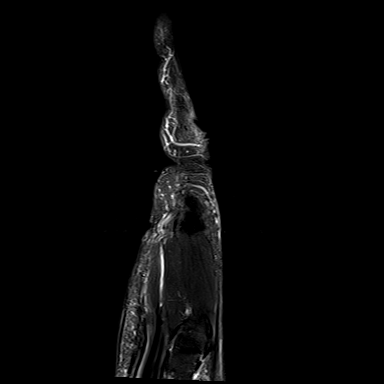
[im 28/38]
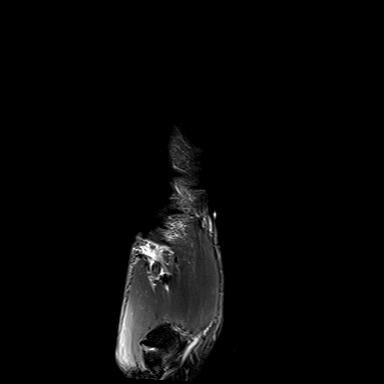
[im 38/38]
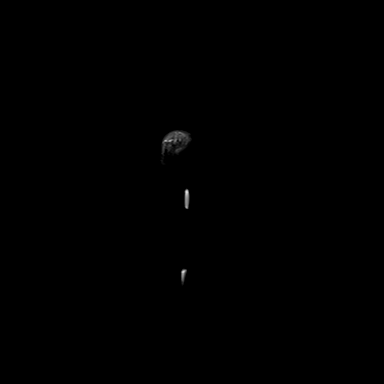

[Series 10: ir_axial · axial · left · 3.0mm · 0.59mm/px · z∈[-138,+18]mm · 7 of 50 slices shown]
[im 1/50]
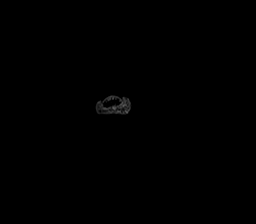
[im 9/50]
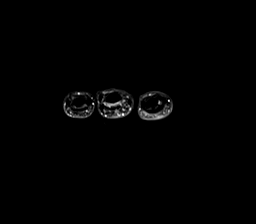
[im 17/50]
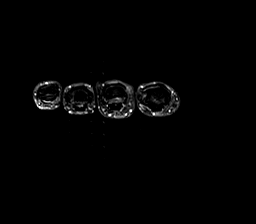
[im 25/50]
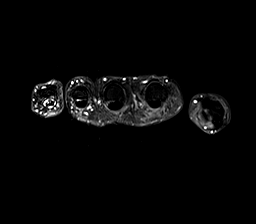
[im 33/50]
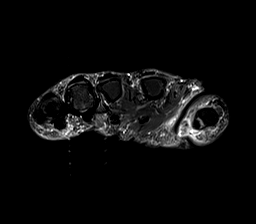
[im 41/50]
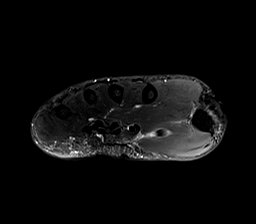
[im 50/50]
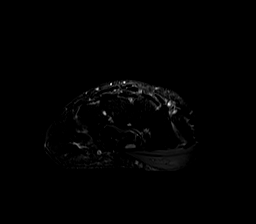

[36 of 40 positions shown; findings below may reference images not displayed]

FINDINGS: Osseous: No acute fracture, avascular necrosis or aggressive osseous lesion. Moderate first and moderate to severe second MCP joint arthrosis. Mild arthrosis of the third finger MCP joint. Mild scattered osteoarthritis of the interphalangeal joints of the fingers. No erosions. Moderate first CMC and mild triscaphe osteoarthritis.

Musculotendinous: Moderate tenosynovitis of the flexor pollicis longus tendon, with short-segment tear near the head of the first metacarpal. A 7 mm focus of susceptibility artifact is located near the first metacarpal head along the flexor pollicis longus tendon, which may represent metallic foreign body. The flexor and extensor tendons are otherwise intact. No tenosynovitis. No tendon bowstringing.

Ligaments: The radial collateral and ulnar collateral ligaments of the IP and MCP joints are intact.

Other: No suspicious soft tissue mass.
IMPRESSION: 1.
Moderate tenosynovitis of the flexor pollicis longus tendon, with short-segment split tear near the head of the first metacarpal.

2.
7 mm focus of susceptibility artifact along the flexor pollicis longus tendon at the level of the first metacarpal head, which may represent metallic foreign body. Correlate with radiographs.

3.
Osteoarthritis of the left hand, most pronounced involving the second MCP joint.
